# Patient Record
Sex: Male | Born: 1940 | Race: White | Hispanic: No | Marital: Married | Smoking: Never smoker
Health system: Southern US, Community
[De-identification: ages and names within clinical notes are randomized; demographics above are authoritative.]

## PROBLEM LIST (undated history)

## (undated) DIAGNOSIS — S42411A Displaced simple supracondylar fracture without intercondylar fracture of right humerus, initial encounter for closed fracture: Secondary | ICD-10-CM

## (undated) DIAGNOSIS — H579 Unspecified disorder of eye and adnexa: Secondary | ICD-10-CM

## (undated) DIAGNOSIS — I10 Essential (primary) hypertension: Secondary | ICD-10-CM

## (undated) DIAGNOSIS — H532 Diplopia: Secondary | ICD-10-CM

## (undated) HISTORY — PX: SHOULDER SURGERY: SHX246

## (undated) HISTORY — DX: Unspecified disorder of eye and adnexa: H57.9

## (undated) HISTORY — DX: Essential (primary) hypertension: I10

## (undated) HISTORY — DX: Diplopia: H53.2

---

## 2002-10-12 ENCOUNTER — Ambulatory Visit (HOSPITAL_COMMUNITY): Admission: RE | Admit: 2002-10-12 | Discharge: 2002-10-12 | Payer: Self-pay | Admitting: Gastroenterology

## 2014-10-19 ENCOUNTER — Encounter (HOSPITAL_COMMUNITY): Payer: Self-pay | Admitting: Emergency Medicine

## 2014-10-19 ENCOUNTER — Emergency Department (HOSPITAL_COMMUNITY)
Admission: EM | Admit: 2014-10-19 | Discharge: 2014-10-19 | Disposition: A | Discharge status: Home / Self Care | Payer: Worker's Compensation | Attending: Emergency Medicine | Admitting: Emergency Medicine

## 2014-10-19 ENCOUNTER — Emergency Department (HOSPITAL_COMMUNITY): Payer: Worker's Compensation

## 2014-10-19 DIAGNOSIS — S42421A Displaced comminuted supracondylar fracture without intercondylar fracture of right humerus, initial encounter for closed fracture: Secondary | ICD-10-CM | POA: Insufficient documentation

## 2014-10-19 DIAGNOSIS — S42411A Displaced simple supracondylar fracture without intercondylar fracture of right humerus, initial encounter for closed fracture: Secondary | ICD-10-CM

## 2014-10-19 DIAGNOSIS — Y9301 Activity, walking, marching and hiking: Secondary | ICD-10-CM | POA: Insufficient documentation

## 2014-10-19 DIAGNOSIS — S4991XA Unspecified injury of right shoulder and upper arm, initial encounter: Secondary | ICD-10-CM | POA: Diagnosis present

## 2014-10-19 DIAGNOSIS — Y92481 Parking lot as the place of occurrence of the external cause: Secondary | ICD-10-CM | POA: Diagnosis not present

## 2014-10-19 DIAGNOSIS — Y998 Other external cause status: Secondary | ICD-10-CM | POA: Insufficient documentation

## 2014-10-19 MED ORDER — HYDROCODONE-ACETAMINOPHEN 5-325 MG PO TABS: 1.0000 | ORAL_TABLET | ORAL | Status: DC | PRN

## 2014-10-19 NOTE — Discharge Instructions (Signed)
Follow-up with orthopedics preferably went into the week. Splint, sling, ice pack, pain medicines.
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
## 2014-10-19 NOTE — ED Notes (Signed)
Placed call to Ortho tech and they will splint long arm
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
## 2014-10-20 ENCOUNTER — Other Ambulatory Visit: Payer: Self-pay | Admitting: Orthopedic Surgery

## 2014-10-20 DIAGNOSIS — R52 Pain, unspecified: Secondary | ICD-10-CM

## 2014-10-24 ENCOUNTER — Inpatient Hospital Stay: Admission: RE | Admit: 2014-10-24 | Payer: Self-pay | Source: Ambulatory Visit

## 2014-10-24 ENCOUNTER — Ambulatory Visit
Admission: RE | Admit: 2014-10-24 | Discharge: 2014-10-24 | Disposition: A | Discharge status: Home / Self Care | Payer: Worker's Compensation | Source: Ambulatory Visit | Attending: Orthopedic Surgery | Admitting: Orthopedic Surgery

## 2014-10-24 DIAGNOSIS — R52 Pain, unspecified: Secondary | ICD-10-CM

## 2014-10-25 ENCOUNTER — Encounter (HOSPITAL_BASED_OUTPATIENT_CLINIC_OR_DEPARTMENT_OTHER): Payer: Self-pay | Admitting: *Deleted

## 2014-10-25 NOTE — Progress Notes (Signed)
Bring all medications. Pack an overnight bag. 

## 2014-10-28 ENCOUNTER — Ambulatory Visit (HOSPITAL_BASED_OUTPATIENT_CLINIC_OR_DEPARTMENT_OTHER): Payer: Worker's Compensation | Admitting: Anesthesiology

## 2014-10-28 ENCOUNTER — Encounter (HOSPITAL_BASED_OUTPATIENT_CLINIC_OR_DEPARTMENT_OTHER): Payer: Self-pay | Admitting: Anesthesiology

## 2014-10-28 ENCOUNTER — Ambulatory Visit (HOSPITAL_BASED_OUTPATIENT_CLINIC_OR_DEPARTMENT_OTHER)
Admission: RE | Admit: 2014-10-28 | Discharge: 2014-10-29 | Disposition: A | Discharge status: Home / Self Care | Payer: Worker's Compensation | Source: Ambulatory Visit | Attending: Orthopedic Surgery | Admitting: Orthopedic Surgery

## 2014-10-28 ENCOUNTER — Ambulatory Visit (HOSPITAL_COMMUNITY): Payer: Worker's Compensation

## 2014-10-28 ENCOUNTER — Encounter (HOSPITAL_BASED_OUTPATIENT_CLINIC_OR_DEPARTMENT_OTHER)
Admission: RE | Disposition: A | Discharge status: Home / Self Care | Payer: Self-pay | Source: Ambulatory Visit | Attending: Orthopedic Surgery

## 2014-10-28 DIAGNOSIS — S42411A Displaced simple supracondylar fracture without intercondylar fracture of right humerus, initial encounter for closed fracture: Secondary | ICD-10-CM | POA: Diagnosis present

## 2014-10-28 DIAGNOSIS — S42409A Unspecified fracture of lower end of unspecified humerus, initial encounter for closed fracture: Secondary | ICD-10-CM | POA: Diagnosis present

## 2014-10-28 DIAGNOSIS — Z8781 Personal history of (healed) traumatic fracture: Secondary | ICD-10-CM

## 2014-10-28 DIAGNOSIS — Z9889 Other specified postprocedural states: Secondary | ICD-10-CM

## 2014-10-28 DIAGNOSIS — Z4789 Encounter for other orthopedic aftercare: Secondary | ICD-10-CM

## 2014-10-28 HISTORY — PX: ULNAR NERVE TRANSPOSITION: SHX2595

## 2014-10-28 HISTORY — PX: ORIF HUMERUS FRACTURE: SHX2126

## 2014-10-28 HISTORY — DX: Displaced simple supracondylar fracture without intercondylar fracture of right humerus, initial encounter for closed fracture: S42.411A

## 2014-10-28 LAB — POCT HEMOGLOBIN-HEMACUE: Hemoglobin: 15.7 g/dL (ref 13.0–17.0)

## 2014-10-28 SURGERY — OPEN REDUCTION INTERNAL FIXATION (ORIF) DISTAL HUMERUS FRACTURE
Anesthesia: General | Site: Elbow | Laterality: Right

## 2014-10-28 MED ORDER — CEFAZOLIN SODIUM-DEXTROSE 2-3 GM-% IV SOLR
2.0000 g | INTRAVENOUS | Status: AC
  Administered 2014-10-28: 2 g via INTRAVENOUS

## 2014-10-28 MED ORDER — DEXAMETHASONE SODIUM PHOSPHATE 10 MG/ML IJ SOLN
INTRAMUSCULAR | Status: AC
  Filled 2014-10-28: qty 1

## 2014-10-28 MED ORDER — DOCUSATE SODIUM 100 MG PO CAPS
100.0000 mg | ORAL_CAPSULE | Freq: Two times a day (BID) | ORAL | Status: DC
  Administered 2014-10-28: 100 mg via ORAL
  Filled 2014-10-28: qty 1

## 2014-10-28 MED ORDER — SENNA-DOCUSATE SODIUM 8.6-50 MG PO TABS: 2.0000 | ORAL_TABLET | Freq: Every day | ORAL | Status: DC

## 2014-10-28 MED ORDER — OXYCODONE-ACETAMINOPHEN 5-325 MG PO TABS
1.0000 | ORAL_TABLET | ORAL | Status: DC | PRN
  Administered 2014-10-28 – 2014-10-29 (×2): 1 via ORAL
  Administered 2014-10-29: 2 via ORAL
  Filled 2014-10-28 (×2): qty 1
  Filled 2014-10-28: qty 2

## 2014-10-28 MED ORDER — PHENYLEPHRINE HCL 10 MG/ML IJ SOLN
10.0000 mg | INTRAVENOUS | Status: DC | PRN
  Administered 2014-10-28: 50 ug/min via INTRAVENOUS

## 2014-10-28 MED ORDER — BISACODYL 10 MG RE SUPP: 10.0000 mg | Freq: Every day | RECTAL | Status: DC | PRN

## 2014-10-28 MED ORDER — KETOROLAC TROMETHAMINE 30 MG/ML IJ SOLN
30.0000 mg | Freq: Once | INTRAMUSCULAR | Status: AC
  Administered 2014-10-28: 30 mg via INTRAVENOUS

## 2014-10-28 MED ORDER — MIDAZOLAM HCL 2 MG/2ML IJ SOLN
1.0000 mg | INTRAMUSCULAR | Status: DC | PRN
  Administered 2014-10-28: 0.5 mg via INTRAVENOUS

## 2014-10-28 MED ORDER — KETOROLAC TROMETHAMINE 15 MG/ML IJ SOLN
INTRAMUSCULAR | Status: AC
  Filled 2014-10-28: qty 2

## 2014-10-28 MED ORDER — FENTANYL CITRATE (PF) 100 MCG/2ML IJ SOLN
50.0000 ug | INTRAMUSCULAR | Status: AC | PRN
  Administered 2014-10-28 (×2): 25 ug via INTRAVENOUS
  Administered 2014-10-28 (×2): 50 ug via INTRAVENOUS

## 2014-10-28 MED ORDER — FENTANYL CITRATE (PF) 100 MCG/2ML IJ SOLN
INTRAMUSCULAR | Status: AC
  Filled 2014-10-28: qty 2

## 2014-10-28 MED ORDER — GLYCOPYRROLATE 0.2 MG/ML IJ SOLN: 0.2000 mg | Freq: Once | INTRAMUSCULAR | Status: DC | PRN

## 2014-10-28 MED ORDER — LIDOCAINE-EPINEPHRINE (PF) 1.5 %-1:200000 IJ SOLN
INTRAMUSCULAR | Status: DC | PRN
  Administered 2014-10-28: 10 mL via PERINEURAL

## 2014-10-28 MED ORDER — LIDOCAINE HCL (CARDIAC) 20 MG/ML IV SOLN
INTRAVENOUS | Status: DC | PRN
  Administered 2014-10-28: 70 mg via INTRAVENOUS

## 2014-10-28 MED ORDER — FENTANYL CITRATE (PF) 100 MCG/2ML IJ SOLN
INTRAMUSCULAR | Status: AC
  Filled 2014-10-28: qty 4

## 2014-10-28 MED ORDER — ARTIFICIAL TEARS OP OINT
TOPICAL_OINTMENT | OPHTHALMIC | Status: AC
  Filled 2014-10-28: qty 3.5

## 2014-10-28 MED ORDER — SUCCINYLCHOLINE CHLORIDE 20 MG/ML IJ SOLN
INTRAMUSCULAR | Status: DC | PRN
  Administered 2014-10-28: 100 mg via INTRAVENOUS

## 2014-10-28 MED ORDER — LACTATED RINGERS IV SOLN
INTRAVENOUS | Status: DC
  Administered 2014-10-28: 10 mL/h via INTRAVENOUS
  Administered 2014-10-28: 07:00:00 via INTRAVENOUS

## 2014-10-28 MED ORDER — VANCOMYCIN HCL IN DEXTROSE 1-5 GM/200ML-% IV SOLN
INTRAVENOUS | Status: AC
  Filled 2014-10-28: qty 200

## 2014-10-28 MED ORDER — PROPOFOL 10 MG/ML IV BOLUS
INTRAVENOUS | Status: DC | PRN
  Administered 2014-10-28: 50 mg via INTRAVENOUS
  Administered 2014-10-28: 100 mg via INTRAVENOUS

## 2014-10-28 MED ORDER — METHOCARBAMOL 500 MG PO TABS
500.0000 mg | ORAL_TABLET | Freq: Four times a day (QID) | ORAL | Status: DC | PRN
  Administered 2014-10-29: 500 mg via ORAL
  Filled 2014-10-28: qty 1

## 2014-10-28 MED ORDER — METOCLOPRAMIDE HCL 5 MG PO TABS: 5.0000 mg | ORAL_TABLET | Freq: Three times a day (TID) | ORAL | Status: DC | PRN

## 2014-10-28 MED ORDER — METOCLOPRAMIDE HCL 5 MG/ML IJ SOLN: 5.0000 mg | Freq: Three times a day (TID) | INTRAMUSCULAR | Status: DC | PRN

## 2014-10-28 MED ORDER — OXYCODONE-ACETAMINOPHEN 10-325 MG PO TABS: 1.0000 | ORAL_TABLET | Freq: Four times a day (QID) | ORAL | Status: DC | PRN

## 2014-10-28 MED ORDER — OXYCODONE HCL 5 MG PO TABS: 5.0000 mg | ORAL_TABLET | ORAL | Status: DC | PRN

## 2014-10-28 MED ORDER — ZOLPIDEM TARTRATE 5 MG PO TABS: 5.0000 mg | ORAL_TABLET | Freq: Every evening | ORAL | Status: DC | PRN

## 2014-10-28 MED ORDER — VANCOMYCIN HCL 1000 MG IV SOLR
1000.0000 mg | INTRAVENOUS | Status: DC | PRN
  Administered 2014-10-28: 1000 mg via INTRAVENOUS

## 2014-10-28 MED ORDER — POLYETHYLENE GLYCOL 3350 17 G PO PACK: 17.0000 g | PACK | Freq: Every day | ORAL | Status: DC | PRN

## 2014-10-28 MED ORDER — CEFAZOLIN SODIUM-DEXTROSE 2-3 GM-% IV SOLR
INTRAVENOUS | Status: AC
  Filled 2014-10-28: qty 50

## 2014-10-28 MED ORDER — BACLOFEN 10 MG PO TABS: 10.0000 mg | ORAL_TABLET | Freq: Three times a day (TID) | ORAL | Status: DC

## 2014-10-28 MED ORDER — SODIUM CHLORIDE 0.9 % IV SOLN
INTRAVENOUS | Status: DC
  Administered 2014-10-28: 14:00:00 via INTRAVENOUS

## 2014-10-28 MED ORDER — SENNA 8.6 MG PO TABS
1.0000 | ORAL_TABLET | Freq: Two times a day (BID) | ORAL | Status: DC
  Administered 2014-10-28: 8.6 mg via ORAL
  Filled 2014-10-28: qty 1

## 2014-10-28 MED ORDER — DEXTROSE 5 % IV SOLN: 500.0000 mg | Freq: Four times a day (QID) | INTRAVENOUS | Status: DC | PRN

## 2014-10-28 MED ORDER — ONDANSETRON HCL 4 MG PO TABS: 4.0000 mg | ORAL_TABLET | Freq: Four times a day (QID) | ORAL | Status: DC | PRN

## 2014-10-28 MED ORDER — PHENYLEPHRINE HCL 10 MG/ML IJ SOLN
INTRAMUSCULAR | Status: AC
  Filled 2014-10-28: qty 1

## 2014-10-28 MED ORDER — PROPOFOL 10 MG/ML IV BOLUS
INTRAVENOUS | Status: AC
  Filled 2014-10-28: qty 20

## 2014-10-28 MED ORDER — LIDOCAINE HCL (CARDIAC) 20 MG/ML IV SOLN
INTRAVENOUS | Status: AC
  Filled 2014-10-28: qty 5

## 2014-10-28 MED ORDER — PROMETHAZINE HCL 25 MG/ML IJ SOLN
6.2500 mg | INTRAMUSCULAR | Status: DC | PRN
Start: 2014-10-28 — End: 2014-10-29

## 2014-10-28 MED ORDER — DEXAMETHASONE SODIUM PHOSPHATE 4 MG/ML IJ SOLN
INTRAMUSCULAR | Status: DC | PRN
  Administered 2014-10-28: 10 mg via INTRAVENOUS

## 2014-10-28 MED ORDER — HYDROMORPHONE HCL 1 MG/ML IJ SOLN
0.5000 mg | INTRAMUSCULAR | Status: DC | PRN
  Administered 2014-10-29: 0.5 mg via INTRAVENOUS
  Administered 2014-10-29: 1 mg via INTRAVENOUS
  Administered 2014-10-29: 0.5 mg via INTRAVENOUS
  Filled 2014-10-28 (×2): qty 1

## 2014-10-28 MED ORDER — ONDANSETRON HCL 4 MG/2ML IJ SOLN
INTRAMUSCULAR | Status: DC | PRN
  Administered 2014-10-28: 4 mg via INTRAVENOUS

## 2014-10-28 MED ORDER — MAGNESIUM CITRATE PO SOLN: 1.0000 | Freq: Once | ORAL | Status: DC | PRN

## 2014-10-28 MED ORDER — BUPIVACAINE HCL (PF) 0.5 % IJ SOLN
INTRAMUSCULAR | Status: DC | PRN
  Administered 2014-10-28: 20 mL via PERINEURAL

## 2014-10-28 MED ORDER — SCOPOLAMINE 1 MG/3DAYS TD PT72: 1.0000 | MEDICATED_PATCH | Freq: Once | TRANSDERMAL | Status: DC | PRN

## 2014-10-28 MED ORDER — ONDANSETRON HCL 4 MG/2ML IJ SOLN: 4.0000 mg | Freq: Four times a day (QID) | INTRAMUSCULAR | Status: DC | PRN

## 2014-10-28 MED ORDER — FENTANYL CITRATE (PF) 100 MCG/2ML IJ SOLN
25.0000 ug | INTRAMUSCULAR | Status: DC | PRN
  Administered 2014-10-28: 50 ug via INTRAVENOUS

## 2014-10-28 MED ORDER — SUCCINYLCHOLINE CHLORIDE 20 MG/ML IJ SOLN
INTRAMUSCULAR | Status: AC
  Filled 2014-10-28: qty 1

## 2014-10-28 MED ORDER — ONDANSETRON HCL 4 MG/2ML IJ SOLN
INTRAMUSCULAR | Status: AC
  Filled 2014-10-28: qty 2

## 2014-10-28 MED ORDER — ONDANSETRON HCL 4 MG PO TABS: 4.0000 mg | ORAL_TABLET | Freq: Three times a day (TID) | ORAL | Status: DC | PRN

## 2014-10-28 MED ORDER — CEFAZOLIN SODIUM 1-5 GM-% IV SOLN
1.0000 g | Freq: Four times a day (QID) | INTRAVENOUS | Status: AC
  Administered 2014-10-28 – 2014-10-29 (×3): 1 g via INTRAVENOUS
  Filled 2014-10-28 (×3): qty 50

## 2014-10-28 MED ORDER — MIDAZOLAM HCL 2 MG/2ML IJ SOLN
INTRAMUSCULAR | Status: AC
  Filled 2014-10-28: qty 2

## 2014-10-28 SURGICAL SUPPLY — 115 items
9 Hole Posterior Lateral Right Plate (Plate) ×3 IMPLANT
9 hole Medial Right Plate (Plate) ×3 IMPLANT
BANDAGE ELASTIC 3 VELCRO ST LF (GAUZE/BANDAGES/DRESSINGS) ×3 IMPLANT
BANDAGE ELASTIC 4 VELCRO ST LF (GAUZE/BANDAGES/DRESSINGS) ×3 IMPLANT
BIT DRILL 2.5X2.75 QC CALB (BIT) ×3 IMPLANT
BIT DRILL CALIBRATED 2.7 (BIT) ×2 IMPLANT
BIT DRILL CALIBRATED 2.7MM (BIT) ×1
BLADE AVERAGE 25MMX9MM (BLADE) ×1
BLADE AVERAGE 25X9 (BLADE) ×2 IMPLANT
BLADE HEX COATED 2.75 (ELECTRODE) ×3 IMPLANT
BLADE MINI RND TIP GREEN BEAV (BLADE) IMPLANT
BLADE OSC/SAG .038X5.5 CUT EDG (BLADE) ×3 IMPLANT
BLADE SURG 15 STRL LF DISP TIS (BLADE) ×1 IMPLANT
BLADE SURG 15 STRL SS (BLADE) ×2
BNDG COHESIVE 4X5 TAN STRL (GAUZE/BANDAGES/DRESSINGS) ×3 IMPLANT
BNDG ESMARK 4X9 LF (GAUZE/BANDAGES/DRESSINGS) ×3 IMPLANT
CANISTER SUCT 1200ML W/VALVE (MISCELLANEOUS) ×3 IMPLANT
CHLORAPREP W/TINT 26ML (MISCELLANEOUS) ×3 IMPLANT
CLOSURE STERI-STRIP 1/2X4 (GAUZE/BANDAGES/DRESSINGS) ×2
CLSR STERI-STRIP ANTIMIC 1/2X4 (GAUZE/BANDAGES/DRESSINGS) ×4 IMPLANT
CORDS BIPOLAR (ELECTRODE) ×3 IMPLANT
COVER BACK TABLE 60X90IN (DRAPES) ×3 IMPLANT
COVER MAYO STAND STRL (DRAPES) ×3 IMPLANT
CUFF TOURN SGL LL 18 NRW (TOURNIQUET CUFF) ×3 IMPLANT
CUFF TOURNIQUET SINGLE 18IN (TOURNIQUET CUFF) IMPLANT
CUFF TOURNIQUET SINGLE 24IN (TOURNIQUET CUFF) IMPLANT
Circlage Cable with Needle and Crimps 1.3mm Diamet (Cable) ×3 IMPLANT
DECANTER SPIKE VIAL GLASS SM (MISCELLANEOUS) IMPLANT
DRAIN PENROSE 1/2X12 LTX STRL (WOUND CARE) IMPLANT
DRAPE C-ARM 42X72 X-RAY (DRAPES) ×3 IMPLANT
DRAPE EXTREMITY T 121X128X90 (DRAPE) IMPLANT
DRAPE IMP U-DRAPE 54X76 (DRAPES) ×6 IMPLANT
DRAPE INCISE IOBAN 66X45 STRL (DRAPES) ×3 IMPLANT
DRAPE OEC MINIVIEW 54X84 (DRAPES) IMPLANT
DRAPE SURG 17X23 STRL (DRAPES) ×3 IMPLANT
DRAPE U 20/CS (DRAPES) ×3 IMPLANT
DURAPREP 26ML APPLICATOR (WOUND CARE) ×3 IMPLANT
ELECT REM PT RETURN 9FT ADLT (ELECTROSURGICAL) ×3
ELECTRODE REM PT RTRN 9FT ADLT (ELECTROSURGICAL) ×1 IMPLANT
GAUZE SPONGE 4X4 12PLY STRL (GAUZE/BANDAGES/DRESSINGS) ×3 IMPLANT
GLOVE BIO SURGEON STRL SZ8 (GLOVE) ×3 IMPLANT
GLOVE BIOGEL PI IND STRL 6.5 (GLOVE) ×2 IMPLANT
GLOVE BIOGEL PI IND STRL 7.0 (GLOVE) ×3 IMPLANT
GLOVE BIOGEL PI IND STRL 7.5 (GLOVE) ×1 IMPLANT
GLOVE BIOGEL PI IND STRL 8 (GLOVE) ×2 IMPLANT
GLOVE BIOGEL PI INDICATOR 6.5 (GLOVE) ×4
GLOVE BIOGEL PI INDICATOR 7.0 (GLOVE) ×6
GLOVE BIOGEL PI INDICATOR 7.5 (GLOVE) ×2
GLOVE BIOGEL PI INDICATOR 8 (GLOVE) ×4
GLOVE ECLIPSE 6.5 STRL STRAW (GLOVE) ×6 IMPLANT
GLOVE ORTHO TXT STRL SZ7.5 (GLOVE) ×3 IMPLANT
GLOVE SURG ORTHO 8.0 STRL STRW (GLOVE) ×3 IMPLANT
GOWN STRL REUS W/ TWL LRG LVL3 (GOWN DISPOSABLE) ×2 IMPLANT
GOWN STRL REUS W/ TWL XL LVL3 (GOWN DISPOSABLE) ×2 IMPLANT
GOWN STRL REUS W/TWL LRG LVL3 (GOWN DISPOSABLE) ×4
GOWN STRL REUS W/TWL XL LVL3 (GOWN DISPOSABLE) ×4
K-WIRE FIXATION 2.0X6 (WIRE) ×3
K-WIRE SGLE END .054 LG (WIRE) ×12
KWIRE FIXATION 2.0X6 (WIRE) ×1 IMPLANT
KWIRE SGLE END .054 LG (WIRE) ×4 IMPLANT
LOOP VESSEL MAXI BLUE (MISCELLANEOUS) ×3 IMPLANT
NEEDLE HYPO 25X1 1.5 SAFETY (NEEDLE) IMPLANT
NS IRRIG 1000ML POUR BTL (IV SOLUTION) ×3 IMPLANT
PACK BASIN DAY SURGERY FS (CUSTOM PROCEDURE TRAY) ×3 IMPLANT
PAD CAST 3X4 CTTN HI CHSV (CAST SUPPLIES) ×1 IMPLANT
PAD CAST 4YDX4 CTTN HI CHSV (CAST SUPPLIES) ×1 IMPLANT
PADDING CAST ABS 3INX4YD NS (CAST SUPPLIES) ×2
PADDING CAST ABS 4INX4YD NS (CAST SUPPLIES) ×2
PADDING CAST ABS COTTON 3X4 (CAST SUPPLIES) ×1 IMPLANT
PADDING CAST ABS COTTON 4X4 ST (CAST SUPPLIES) ×1 IMPLANT
PADDING CAST COTTON 3X4 STRL (CAST SUPPLIES) ×2
PADDING CAST COTTON 4X4 STRL (CAST SUPPLIES) ×2
PENCIL BUTTON HOLSTER BLD 10FT (ELECTRODE) ×3 IMPLANT
SCREW CORT T15 28X3.5XST LCK (Screw) ×1 IMPLANT
SCREW CORTICAL 3.5X28MM (Screw) ×2 IMPLANT
SCREW CORTICAL LOW PROF 3.5X20 (Screw) ×3 IMPLANT
SCREW LOCK CORT STAR 3.5X10 (Screw) ×9 IMPLANT
SCREW LOCK CORT STAR 3.5X22 (Screw) ×3 IMPLANT
SCREW LOW PROFILE 18MMX3.5MM (Screw) ×3 IMPLANT
SCREW LP 3.5 (Screw) ×3 IMPLANT
SCREW LP 3.5X65MM (Screw) ×3 IMPLANT
SCREW NON LOCKING LP 3.5 14MM (Screw) ×3 IMPLANT
SHEET MEDIUM DRAPE 40X70 STRL (DRAPES) ×3 IMPLANT
SLEEVE SCD COMPRESS KNEE MED (MISCELLANEOUS) ×3 IMPLANT
SLING ARM FOAM STRAP LRG (SOFTGOODS) ×3 IMPLANT
SPLINT FAST PLASTER 5X30 (CAST SUPPLIES) ×20
SPLINT PLASTER CAST FAST 5X30 (CAST SUPPLIES) ×10 IMPLANT
SPLINT PLASTER CAST XFAST 3X15 (CAST SUPPLIES) IMPLANT
SPLINT PLASTER CAST XFAST 4X15 (CAST SUPPLIES) IMPLANT
SPLINT PLASTER XTRA FAST SET 4 (CAST SUPPLIES)
SPLINT PLASTER XTRA FASTSET 3X (CAST SUPPLIES)
SPONGE LAP 4X18 X RAY DECT (DISPOSABLE) ×9 IMPLANT
STOCKINETTE 4X48 STRL (DRAPES) ×3 IMPLANT
SUCTION FRAZIER TIP 10 FR DISP (SUCTIONS) ×3 IMPLANT
SUT ETHIBOND 0 MO6 C/R (SUTURE) IMPLANT
SUT ETHILON 3 0 PS 1 (SUTURE) IMPLANT
SUT ETHILON 4 0 PS 2 18 (SUTURE) IMPLANT
SUT MNCRL AB 4-0 PS2 18 (SUTURE) ×3 IMPLANT
SUT VIC AB 0 CT1 18XCR BRD 8 (SUTURE) IMPLANT
SUT VIC AB 0 CT1 27 (SUTURE) ×6
SUT VIC AB 0 CT1 27XBRD ANBCTR (SUTURE) ×3 IMPLANT
SUT VIC AB 0 CT1 8-18 (SUTURE)
SUT VIC AB 0 SH 27 (SUTURE) IMPLANT
SUT VIC AB 2-0 SH 18 (SUTURE) IMPLANT
SUT VIC AB 3-0 SH 27 (SUTURE)
SUT VIC AB 3-0 SH 27X BRD (SUTURE) IMPLANT
SUT VICRYL 3-0 CR8 SH (SUTURE) ×3 IMPLANT
SYR BULB 3OZ (MISCELLANEOUS) ×3 IMPLANT
SYR CONTROL 10ML LL (SYRINGE) IMPLANT
TOWEL OR 17X24 6PK STRL BLUE (TOWEL DISPOSABLE) ×3 IMPLANT
TOWEL OR NON WOVEN STRL DISP B (DISPOSABLE) ×3 IMPLANT
TUBE CONNECTING 20'X1/4 (TUBING) ×1
TUBE CONNECTING 20X1/4 (TUBING) ×2 IMPLANT
UNDERPAD 30X30 (UNDERPADS AND DIAPERS) ×3 IMPLANT
YANKAUER SUCT BULB TIP NO VENT (SUCTIONS) ×3 IMPLANT

## 2014-10-28 NOTE — Anesthesia Postprocedure Evaluation (Signed)
  Anesthesia Post-op Note  Patient: Dustin Le  Procedure(s) Performed: Procedure(s) (LRB): RIGHT OPEN REDUCTION INTERNAL FIXATION (ORIF) DISTAL HUMERUS; OSTEOTOMY ULNA (Right) TRANSPOSITION NEUROPLASTY ULNAR NERVE ELBOW  (Right)  Patient Location: PACU  Anesthesia Type: GA combined with regional for post-op pain  Level of Consciousness: awake and alert   Airway and Oxygen Therapy: Patient Spontanous Breathing  Post-op Pain: mild  Post-op Assessment: Post-op Vital signs reviewed, Patient's Cardiovascular Status Stable, Respiratory Function Stable, Patent Airway and No signs of Nausea or vomiting  Last Vitals:  Filed Vitals:   10/28/14 1230  BP:   Pulse: 79  Temp:   Resp: 18    Post-op Vital Signs: stable   Complications: No apparent anesthesia complications

## 2014-10-28 NOTE — Anesthesia Preprocedure Evaluation (Signed)
Anesthesia Evaluation  Patient identified by MRN, date of birth, ID band Patient awake    Reviewed: Allergy & Precautions, NPO status , Patient's Chart, lab work & pertinent test results  Airway Mallampati: II  TM Distance: >3 FB Neck ROM: Full    Dental no notable dental hx.    Pulmonary neg pulmonary ROS,  breath sounds clear to auscultation  Pulmonary exam normal       Cardiovascular negative cardio ROS Normal cardiovascular examRhythm:Regular Rate:Normal     Neuro/Psych negative neurological ROS  negative psych ROS   GI/Hepatic negative GI ROS, Neg liver ROS,   Endo/Other  negative endocrine ROS  Renal/GU negative Renal ROS  negative genitourinary   Musculoskeletal negative musculoskeletal ROS (+)   Abdominal   Peds negative pediatric ROS (+)  Hematology negative hematology ROS (+)   Anesthesia Other Findings   Reproductive/Obstetrics negative OB ROS                             Anesthesia Physical Anesthesia Plan  ASA: II  Anesthesia Plan: General   Post-op Pain Management: GA combined w/ Regional for post-op pain   Induction: Intravenous  Airway Management Planned: Oral ETT  Additional Equipment:   Intra-op Plan:   Post-operative Plan: Extubation in OR  Informed Consent: I have reviewed the patients History and Physical, chart, labs and discussed the procedure including the risks, benefits and alternatives for the proposed anesthesia with the patient or authorized representative who has indicated his/her understanding and acceptance.   Dental advisory given  Plan Discussed with: CRNA and Surgeon  Anesthesia Plan Comments:         Anesthesia Quick Evaluation
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
## 2014-10-28 NOTE — Progress Notes (Signed)
Assisted Dr. Rose with right, ultrasound guided, supraclavicular block. Side rails up, monitors on throughout procedure. See vital signs in flow sheet. Tolerated Procedure well. 

## 2014-10-28 NOTE — Anesthesia Procedure Notes (Addendum)
Anesthesia Regional Block:  Supraclavicular block  Pre-Anesthetic Checklist: ,, timeout performed, Correct Patient, Correct Site, Correct Laterality, Correct Procedure, Correct Position, site marked, Risks and benefits discussed,  Surgical consent,  Pre-op evaluation,  At surgeon's request and post-op pain management  Laterality: Right  Prep: chloraprep       Needles:  Injection technique: Single-shot  Needle Type: Echogenic Needle     Needle Length: 9cm 9 cm Needle Gauge: 21 and 21 G    Additional Needles:  Procedures: ultrasound guided (picture in chart) and nerve stimulator Supraclavicular block Narrative:  Injection made incrementally with aspirations every 5 mL.  Performed by: Personally   Additional Notes: Risks, benefits and alternative to block explained extensively.  Patient tolerated procedure well, without complications.   Procedure Name: Intubation Performed by: York Grice Pre-anesthesia Checklist: Patient identified, Timeout performed, Emergency Drugs available, Suction available and Patient being monitored Patient Re-evaluated:Patient Re-evaluated prior to inductionOxygen Delivery Method: Circle system utilized Preoxygenation: Pre-oxygenation with 100% oxygen Intubation Type: IV induction Ventilation: Mask ventilation without difficulty Laryngoscope Size: Miller and 2 Grade View: Grade I Tube type: Oral Tube size: 7.0 mm Number of attempts: 1 Airway Equipment and Method: Stylet Placement Confirmation: ETT inserted through vocal cords under direct vision,  positive ETCO2 and breath sounds checked- equal and bilateral Secured at: 22 cm Tube secured with: Tape Dental Injury: Teeth and Oropharynx as per pre-operative assessment

## 2014-10-28 NOTE — Discharge Instructions (Addendum)
Diet: As you were doing prior to hospitalization   Shower:  May shower but keep the wounds dry, use an occlusive plastic wrap, NO SOAKING IN TUB.  If the bandage gets wet, change with a clean dry gauze.  If you have a splint on, leave the splint in place and keep the splint dry with a plastic bag.  Dressing:  You may change your dressing 3-5 days after surgery, unless you have a splint.  If you have a splint, then just leave the splint in place and we will change your bandages during your first follow-up appointment.    If you had hand or foot surgery, we will plan to remove your stitches in about 2 weeks in the office.  For all other surgeries, there are sticky tapes (steri-strips) on your wounds and all the stitches are absorbable.  Leave the steri-strips in place when changing your dressings, they will peel off with time, usually 2-3 weeks.  Activity:  Increase activity slowly as tolerated, but follow the weight bearing instructions below.  The rules on driving is that you can not be taking narcotics while you drive, and you must feel in control of the vehicle.    Weight Bearing:   Sling as desired.  Keep splint dry..    To prevent constipation: you may use a stool softener such as -  Colace (over the counter) 100 mg by mouth twice a day  Drink plenty of fluids (prune juice may be helpful) and high fiber foods Miralax (over the counter) for constipation as needed.    Itching:  If you experience itching with your medications, try taking only a single pain pill, or even half a pain pill at a time.  You may take up to 10 pain pills per day, and you can also use benadryl over the counter for itching or also to help with sleep.   Precautions:  If you experience chest pain or shortness of breath - call 911 immediately for transfer to the hospital emergency department!!  If you develop a fever greater that 101 F, purulent drainage from wound, increased redness or drainage from wound, or calf pain  -- Call the office at [phone]                                                Follow- Up Appointment:  Please call for an appointment to be seen in 2 weeks [facility] - [phone]      Post Anesthesia Home Care Instructions  Activity: Get plenty of rest for the remainder of the day. A responsible adult should stay with you for 24 hours following the procedure.  For the next 24 hours, DO NOT: -Drive a car -Advertising copywriter -Drink alcoholic beverages -Take any medication unless instructed by your physician -Make any legal decisions or sign important papers.  Meals: Start with liquid foods such as gelatin or soup. Progress to regular foods as tolerated. Avoid greasy, spicy, heavy foods. If nausea and/or vomiting occur, drink only clear liquids until the nausea and/or vomiting subsides. Call your physician if vomiting continues.  Special Instructions/Symptoms: Your throat may feel dry or sore from the anesthesia or the breathing tube placed in your throat during surgery. If this causes discomfort, gargle with warm salt water. The discomfort should disappear within 24 hours.  If you had a scopolamine patch placed behind  your ear for the management of post- operative nausea and/or vomiting:  1. The medication in the patch is effective for 72 hours, after which it should be removed.  Wrap patch in a tissue and discard in the trash. Wash hands thoroughly with soap and water. 2. You may remove the patch earlier than 72 hours if you experience unpleasant side effects which may include dry mouth, dizziness or visual disturbances. 3. Avoid touching the patch. Wash your hands with soap and water after contact with the patch.   Regional Anesthesia Blocks  1. Numbness or the inability to move the "blocked" extremity may last from 3-48 hours after placement. The length of time depends on the medication injected and your individual response to the medication. If the numbness is not going away  after 48 hours, call your surgeon.  2. The extremity that is blocked will need to be protected until the numbness is gone and the  Strength has returned. Because you cannot feel it, you will need to take extra care to avoid injury. Because it may be weak, you may have difficulty moving it or using it. You may not know what position it is in without looking at it while the block is in effect.  3. For blocks in the legs and feet, returning to weight bearing and walking needs to be done carefully. You will need to wait until the numbness is entirely gone and the strength has returned. You should be able to move your leg and foot normally before you try and bear weight or walk. You will need someone to be with you when you first try to ensure you do not fall and possibly risk injury.  4. Bruising and tenderness at the needle site are common side effects and will resolve in a few days.  5. Persistent numbness or new problems with movement should be communicated to the surgeon or the [facility] [phone] [place] [phone]).

## 2014-10-28 NOTE — Transfer of Care (Signed)
Immediate Anesthesia Transfer of Care Note  Patient: Dustin Le  Procedure(s) Performed: Procedure(s): RIGHT OPEN REDUCTION INTERNAL FIXATION (ORIF) DISTAL HUMERUS; OSTEOTOMY ULNA (Right) TRANSPOSITION NEUROPLASTY ULNAR NERVE ELBOW  (Right)  Patient Location: PACU  Anesthesia Type:General and GA combined with regional for post-op pain  Level of Consciousness: awake and alert   Airway & Oxygen Therapy: Patient Spontanous Breathing and Patient connected to face mask oxygen  Post-op Assessment: Report given to RN and Post -op Vital signs reviewed and stable  Post vital signs: Reviewed and stable  Last Vitals:  Filed Vitals:   10/28/14 0700  BP: 139/82  Pulse: 71  Temp:   Resp:     Complications: No apparent anesthesia complications
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
## 2014-10-29 DIAGNOSIS — S42411A Displaced simple supracondylar fracture without intercondylar fracture of right humerus, initial encounter for closed fracture: Secondary | ICD-10-CM | POA: Diagnosis not present

## 2014-11-01 ENCOUNTER — Encounter (HOSPITAL_BASED_OUTPATIENT_CLINIC_OR_DEPARTMENT_OTHER): Payer: Self-pay | Admitting: Orthopedic Surgery

## 2014-11-04 ENCOUNTER — Encounter (HOSPITAL_BASED_OUTPATIENT_CLINIC_OR_DEPARTMENT_OTHER): Payer: Self-pay | Admitting: Orthopedic Surgery

## 2015-08-04 ENCOUNTER — Other Ambulatory Visit: Payer: Self-pay | Admitting: Family Medicine

## 2015-08-04 DIAGNOSIS — H532 Diplopia: Secondary | ICD-10-CM

## 2015-08-07 ENCOUNTER — Other Ambulatory Visit: Payer: Self-pay | Admitting: Family Medicine

## 2015-08-07 DIAGNOSIS — H532 Diplopia: Secondary | ICD-10-CM

## 2015-08-10 ENCOUNTER — Other Ambulatory Visit: Payer: Self-pay

## 2015-08-15 ENCOUNTER — Other Ambulatory Visit: Payer: Self-pay

## 2015-08-16 ENCOUNTER — Ambulatory Visit
Admission: RE | Admit: 2015-08-16 | Discharge: 2015-08-16 | Disposition: A | Discharge status: Home / Self Care | Payer: Medicare Other | Source: Ambulatory Visit | Attending: Family Medicine | Admitting: Family Medicine

## 2015-08-16 DIAGNOSIS — H532 Diplopia: Secondary | ICD-10-CM

## 2015-08-16 MED ORDER — GADOBENATE DIMEGLUMINE 529 MG/ML IV SOLN
15.0000 mL | Freq: Once | INTRAVENOUS | Status: AC | PRN
Start: 2015-08-16 — End: 2015-08-16
  Administered 2015-08-16: 15 mL via INTRAVENOUS

## 2015-08-28 ENCOUNTER — Encounter: Payer: Self-pay | Admitting: Neurology

## 2015-08-28 ENCOUNTER — Ambulatory Visit (INDEPENDENT_AMBULATORY_CARE_PROVIDER_SITE_OTHER): Payer: Medicare Other | Admitting: Neurology

## 2015-08-28 VITALS — BP 145/87 | HR 72 | Ht 66.75 in | Wt 171.8 lb

## 2015-08-28 DIAGNOSIS — H532 Diplopia: Secondary | ICD-10-CM | POA: Diagnosis not present

## 2015-08-28 MED ORDER — PYRIDOSTIGMINE BROMIDE 60 MG PO TABS: 60.0000 mg | ORAL_TABLET | Freq: Three times a day (TID) | ORAL | Status: DC

## 2015-08-28 NOTE — Progress Notes (Signed)
PATIENT: Dustin Le DOB: 07-27-1940  Chief Complaint  Patient presents with  . Diplopia    He is here with his wife, [name].  He started experiencing double vision on 08/01/15 and had an abnormal evaluation with Dr. Bing Plume.  He was sent to his PCP who ordered MRI scans of his orbits and brain.  He has continued with this vison disturbance and also feels his depth perception is "off".     HISTORICAL  Dustin Le is a 75 years old right handed male, accompanied by his wife, seen in refer by his primary care physician Dr. Melinda Crutch for evaluation of diplopia on July 3rd 2017.  I reviewed and summarized the referring note, he had one episode right vetreous bleeding in 2014, was treated with laser at [facility], most recent lab in June 2017, showed normal CBC,Hg 16.8, normal CMP, creat 0.78, ESR was one, LDL was mildly elevated 107, cholesterol 171.  On June 6th 2017, while working on his home project,he noticed depth perception difficulty, later he noticed constant double vision, worst when looking to right, mostly vertical double vision,  better when looking to the left, began to appear as gaze close to midline.  His double vision seems to be least during early morning.  He denies ptosis, bulbar or limb muscle weakness. He denies dizziness.  He was seen by opthamologist Dr. 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 No discrete aneurysm is evident, although intracranial MRA (without contrast) or CTA (with contrast) would be necessary to exclude aneurysm. Otherwise unremarkable for age MRI appearance of the brain and orbits. No cavernous sinus or skullbase abnormality.   REVIEW OF SYSTEMS: Full 14 system review of systems performed and notable only for as above  ALLERGIES: No Known Allergies  HOME MEDICATIONS: No current outpatient prescriptions on file.   No current facility-administered medications for this visit.    PAST MEDICAL HISTORY: Past Medical History  Diagnosis Date  . Right supracondylar humerus fracture 10/28/2014  . Hypertension   . Eye problem     Right eye bleed  . Diplopia     PAST SURGICAL HISTORY: Past Surgical History  Procedure Laterality Date  . Shoulder surgery Right     Rotator cuff  . Orif humerus fracture Right 10/28/2014    Procedure: RIGHT OPEN REDUCTION INTERNAL FIXATION (ORIF) DISTAL HUMERUS; OSTEOTOMY ULNA;  Surgeon: Marchia Bond, MD;  Location: [place];  Service: Orthopedics;  Laterality: Right;  . Ulnar nerve transposition Right 10/28/2014    Procedure: TRANSPOSITION NEUROPLASTY ULNAR NERVE ELBOW ;  Surgeon: Marchia Bond, MD;  Location: [place];  Service: Orthopedics;  Laterality: Right;    FAMILY HISTORY: Family History  Problem Relation Age of Onset  . Diabetes Mother   . Diabetes Father   . Hypertension Father   . Glaucoma Father     SOCIAL HISTORY:  Social History  Social History  . Marital Status: Married    Spouse Name: N/A  . Number of Children: 2  . Years of Education: Bachelors   Occupational History  . Retired   . Drives for [employer]    Social History Main Topics  . Smoking status: Never Smoker   . Smokeless tobacco: Never Used  . Alcohol Use: No  . Drug Use: No  . Sexual Activity: Not on  file   Other Topics Concern  . Not on file   Social History Narrative   Lives at home with his wife.   Right-handed.   2 cups caffeine per day.     PHYSICAL EXAM   Filed Vitals:   08/28/15 0723  BP: 145/87  Pulse: 72  Height: 5' 6.75" (1.695 m)  Weight: 171 lb 12 oz (77.905 kg)    Not recorded      Body mass index is 27.12 kg/(m^2).  PHYSICAL EXAMNIATION:  Gen: NAD, conversant, well nourised, obese, well groomed                     Cardiovascular: Regular rate rhythm, no peripheral edema, warm, nontender. Eyes: Conjunctivae clear without exudates or hemorrhage Neck: Supple, no carotid bruise. Pulmonary: Clear to auscultation bilaterally   NEUROLOGICAL EXAM:  MENTAL STATUS: Speech:    Speech is normal; fluent and spontaneous with normal comprehension.  Cognition:     Orientation to time, place and person     Normal recent and remote memory     Normal Attention span and concentration     Normal Language, naming, repeating,spontaneous speech     Fund of knowledge   CRANIAL NERVES: CN II: Visual fields are full to confrontation. Pupil were small 15m, reactive to light, I was not able to visualize fundus.   CN III, IV, VI: cover and uncover testing showed right exo/inferopia, left exo/hyperopia, mild eye closure weakness. CN V: Facial sensation is intact to pinprick in all 3 divisions bilaterally. Corneal responses are intact.  CN VII: Face is symmetric with normal eye closure and smile. CN VIII: Hearing is normal to rubbing fingers CN IX, X: Palate elevates symmetrically. Phonation is normal. CN XI: Head turning and shoulder shrug are intact CN XII: Tongue is midline with normal movements and no atrophy.  MOTOR: He has slight neck flexion, mild bilateral shoulder abduction, elbow flexion, hip flexion weakness  REFLEXES: Reflexes are 2+ and symmetric at the biceps, triceps, knees, and ankles. Plantar responses are flexor.  SENSORY: Intact to light touch,  pinprick, positional sensation and vibratory sensation are intact in fingers and toes.  COORDINATION: Rapid alternating movements and fine finger movements are intact. There is no dysmetria on finger-to-nose and heel-knee-shin.    GAIT/STANCE: Posture is normal. Gait is steady with normal steps, base, arm swing, and turning. Heel and toe walking are normal. Tandem gait is normal.  Romberg is absent.   DIAGNOSTIC DATA (LABS, IMAGING, TESTING) - I reviewed patient records, labs, notes, testing and imaging myself where available.   ASSESSMENT AND PLAN  LKHUSH PASIONis a 75y.o. male   New Onset binocular diplopia,  With evidence of mild bilateral extraocular muscle, bulbar, limb muscle weakness.  Most suggestive of neuromuscular junctional disorder, such as myasthenia gravis  Lab, includes Ach receptor antibody, TSH  Trial of mestinon  Return to clinic in 2 weeks.  YMarcial Pacas M.D. Ph.D.  [facility] [address] [place] [address]Ph: [phone]  [phone] Fax: [phone]  CC: Lona Kettle, MD

## 2015-08-28 NOTE — Patient Instructions (Signed)
[email or web address]  Possible myasthenia gravis/neuromuscular junctional disorder

## 2015-08-31 ENCOUNTER — Other Ambulatory Visit: Payer: Self-pay | Admitting: *Deleted

## 2015-08-31 ENCOUNTER — Telehealth: Payer: Self-pay | Admitting: Neurology

## 2015-08-31 DIAGNOSIS — E538 Deficiency of other specified B group vitamins: Secondary | ICD-10-CM

## 2015-08-31 NOTE — Telephone Encounter (Signed)
I have spoken with pt's wife, Gigi Gineggy and per YY, advised it appears he is deficient in vit. B.  YY would like to confirm with one more lab test, and if vit. b12 level is still low, begin vit. b12 replacement.  She verbalized understanding of same. [facility] will come by the lab at their convenience M-Th 8a-1130a and 1230p-5p, and Friday 8a-12p/fim

## 2015-08-31 NOTE — Telephone Encounter (Signed)
Please call patient, laboratory evaluation showed decreased vitamin B12 172, he will benefit to repeat laboratory evaluation to confirm a diagnosis of vitamin B12 deficiency and then began vitamin B12 IM supplement, 1000 g daily for one week, every week for one month, then monthly.   Acetylcholine receptor binding antibody was negative, acetylcholine receptor blocking and modulating antibodies are pending.  Rest of the laboratory evaluations were normal  Please check on him if Mestinon 60 mg 3 times a day has helped his symptoms, may continue follow-up in September 12 2015

## 2015-09-01 ENCOUNTER — Other Ambulatory Visit (INDEPENDENT_AMBULATORY_CARE_PROVIDER_SITE_OTHER): Payer: Self-pay

## 2015-09-01 DIAGNOSIS — H532 Diplopia: Secondary | ICD-10-CM

## 2015-09-01 DIAGNOSIS — Z0289 Encounter for other administrative examinations: Secondary | ICD-10-CM

## 2015-09-01 DIAGNOSIS — E538 Deficiency of other specified B group vitamins: Secondary | ICD-10-CM

## 2015-09-02 LAB — VITAMIN B12: VITAMIN B 12: 178 pg/mL — AB (ref 211–946)

## 2015-09-05 ENCOUNTER — Ambulatory Visit
Admission: RE | Admit: 2015-09-05 | Discharge: 2015-09-05 | Disposition: A | Discharge status: Home / Self Care | Payer: Medicare Other | Source: Ambulatory Visit | Attending: Neurology | Admitting: Neurology

## 2015-09-05 DIAGNOSIS — H532 Diplopia: Secondary | ICD-10-CM | POA: Diagnosis not present

## 2015-09-12 ENCOUNTER — Telehealth: Payer: Self-pay | Admitting: Neurology

## 2015-09-12 ENCOUNTER — Encounter: Payer: Self-pay | Admitting: Neurology

## 2015-09-12 ENCOUNTER — Ambulatory Visit (INDEPENDENT_AMBULATORY_CARE_PROVIDER_SITE_OTHER): Payer: Medicare Other | Admitting: Neurology

## 2015-09-12 VITALS — BP 143/81 | HR 65 | Ht 66.75 in | Wt 172.8 lb

## 2015-09-12 DIAGNOSIS — H4912 Fourth [trochlear] nerve palsy, left eye: Secondary | ICD-10-CM | POA: Insufficient documentation

## 2015-09-12 DIAGNOSIS — H532 Diplopia: Secondary | ICD-10-CM

## 2015-09-12 MED ORDER — CYANOCOBALAMIN 1000 MCG/ML IJ SOLN: 1000.0000 ug | Freq: Once | INTRAMUSCULAR | Status: DC

## 2015-09-12 NOTE — Progress Notes (Signed)
Chief Complaint  Patient presents with  . Diplopia    He is here with his wife, [name].  He is still having intermittent blurred vision. He has also noted more fatigue recently.  Says there has not been any change in his symptoms since starting Mestinon.      PATIENT: Dustin Le DOB: [birth year]  Chief Complaint  Patient presents with  . Diplopia    He is here with his wife, [name].  He is still having intermittent blurred vision. He has also noted more fatigue recently.  Says there has not been any change in his symptoms since starting Mestinon.     HISTORICAL  Dustin Le is a 75 years old right handed male, accompanied by his wife, seen in refer by his primary care physician Dr. Melinda Crutch for evaluation of diplopia on July 3rd 2017.  I reviewed and summarized the referring note, he had one episode right vetreous bleeding in 2014, was treated with laser at [facility], most recent lab in June 2017, showed normal CBC,Hg 16.8, normal CMP, creat 0.78, ESR was one, LDL was mildly elevated 107, cholesterol 171.  On June 6th 2017, while working on his home project,he noticed depth perception difficulty, later he noticed constant double vision, worst when looking to right, mostly vertical double vision,  better when looking to the left, began to appear as gaze close to midline.  His double vision seems to be least during early morning.  He denies ptosis, bulbar or limb muscle weakness. He denies dizziness.  He was seen by opthamologist Dr. 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  Otherwise unremarkable for age MRI appearance of the brain and orbits. No cavernous sinus or skullbase abnormality.  UPDATE July 18th 2017: He was evaluated by ophthalmologist Dr. 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 Posey Pronto again, he continue complains of double vision when looking to the right side, on today's red less testing showed improvement, only has vertical double vision looking to the right inferior visual field.  We have personally reviewed MRA of the brain, mild intracranial atherosclerotic disease, especially on the left PCA  We have reviewed laboratory evaluation, low vitamin B12 172, elevated homocystine level, normal folic acid, normal TSH, negative ANA, ESR, C-reactive protein, acetylcholine receptor antibodies   he has tried to Mestinon without significant improvement, does complains of increased mucous secretion.  REVIEW OF SYSTEMS: Full 14 system review of systems performed and notable only for as above  ALLERGIES: No Known Allergies  HOME MEDICATIONS: Current Outpatient Prescriptions  Medication Sig Dispense Refill  . pyridostigmine (MESTINON) 60 MG tablet Take 1 tablet (60 mg total) by mouth 3 (three) times daily. 90 tablet 6   No current facility-administered medications for this visit.    PAST MEDICAL HISTORY: Past Medical History  Diagnosis Date  . Right supracondylar humerus  fracture 10/28/2014  . Hypertension   . Eye problem     Right eye bleed  . Diplopia     PAST SURGICAL HISTORY: Past Surgical History  Procedure Laterality Date  . Shoulder surgery Right     Rotator cuff  . Orif humerus fracture Right 10/28/2014    Procedure: RIGHT OPEN  REDUCTION INTERNAL FIXATION (ORIF) DISTAL HUMERUS; OSTEOTOMY ULNA;  Surgeon: Marchia Bond, MD;  Location: [place];  Service: Orthopedics;  Laterality: Right;  . Ulnar nerve transposition Right 10/28/2014    Procedure: TRANSPOSITION NEUROPLASTY ULNAR NERVE ELBOW ;  Surgeon: Marchia Bond, MD;  Location: [place];  Service: Orthopedics;  Laterality: Right;    FAMILY HISTORY: Family History  Problem Relation Age of Onset  . Diabetes Mother   . Diabetes Father   . Hypertension Father   . Glaucoma Father     SOCIAL HISTORY:  Social History   Social History  . Marital Status: Married    Spouse Name: N/A  . Number of Children: 2  . Years of Education: Bachelors   Occupational History  . Retired   . Drives for [employer]    Social History Main Topics  . Smoking status: Never Smoker   . Smokeless tobacco: Never Used  . Alcohol Use: No  . Drug Use: No  . Sexual Activity: Not on file   Other Topics Concern  . Not on file   Social History Narrative   Lives at home with his wife.   Right-handed.   2 cups caffeine per day.     PHYSICAL EXAM   Filed Vitals:   09/12/15 0748  BP: 143/81  Pulse: 65  Height: 5' 6.75" (1.695 m)  Weight: 172 lb 12 oz (78.359 kg)    Not recorded      Body mass index is 27.27 kg/(m^2).  PHYSICAL EXAMNIATION:  Gen: NAD, conversant, well nourised, obese, well groomed                     Cardiovascular: Regular rate rhythm, no peripheral edema, warm, nontender. Eyes: Conjunctivae clear without exudates or hemorrhage Neck: Supple, no carotid bruise. Pulmonary: Clear to auscultation bilaterally   NEUROLOGICAL EXAM:  MENTAL STATUS: Speech:    Speech is normal; fluent and spontaneous with normal comprehension.  Cognition:     Orientation to time, place and person     Normal recent and remote memory     Normal Attention span and concentration     Normal Language, naming,  repeating,spontaneous speech     Fund of knowledge   CRANIAL NERVES: CN II: Visual fields are full to confrontation. Pupil were small 16m, reactive to light, I was not able to visualize fundus.   CN III, IV, VI: cover and uncover testing showed right exo/inferopia, left exo/hyperopia, mild eye closure weakness. CN V: Facial sensation is intact to pinprick in all 3 divisions bilaterally. Corneal responses are intact.  CN VII: Face is symmetric with normal eye closure and smile. CN VIII: Hearing is normal to rubbing fingers CN IX, X: Palate elevates symmetrically. Phonation is normal. CN XI: Head turning and shoulder shrug are intact CN XII: Tongue is midline with normal movements and no atrophy.  MOTOR: Normal tone, bulk and strength  REFLEXES: Reflexes are 2+ and symmetric at the biceps, triceps, knees, and ankles. Plantar responses are flexor.  SENSORY: Intact to light touch, pinprick, positional sensation and vibratory sensation are intact in fingers and toes.  COORDINATION: Rapid alternating movements and fine finger movements are intact. There is no dysmetria on finger-to-nose and heel-knee-shin.    GAIT/STANCE: Posture is normal. Gait is steady with normal steps, base, arm swing, and turning. Heel and toe walking are normal. Tandem gait is normal.  Romberg is absent.   DIAGNOSTIC DATA (LABS, IMAGING, TESTING) - I reviewed patient records, labs, notes, testing and imaging myself where available.   ASSESSMENT AND PLAN  Dustin Le is a 75 y.o. 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  Marcial Pacas, M.D. Ph.D.  [facility] [address] [place], [place] [address] Ph: [phone] Fax: [phone]  CC: Lona Kettle, MD

## 2015-09-12 NOTE — Telephone Encounter (Signed)
Lisa[facility] called with a question regarding cyanocobalamin (,VITAMIN B-12,) 1000 MCG/ML injection. Please call

## 2015-09-12 NOTE — Telephone Encounter (Signed)
[place] rx has been clarified that the injections are once monthly.

## 2015-09-17 LAB — ACETYLCHOLINE RECEPTOR AB, ALL
Acetylchol Block Ab: 13 % (ref 0–25)
Acetylcholine Modulat Ab: <12 % (ref 0–20)

## 2015-09-17 LAB — SEDIMENTATION RATE: Sed Rate: 2 mm/hr (ref 0–30)

## 2015-09-17 LAB — C-REACTIVE PROTEIN: CRP: 0.7 mg/L (ref 0.0–4.9)

## 2015-09-17 LAB — FOLATE: FOLATE: 11.3 ng/mL (ref >3.0)

## 2015-09-17 LAB — THYROID PANEL WITH TSH
Free Thyroxine Index: 1.9 (ref 1.2–4.9)
T3 UPTAKE RATIO: 29 % (ref 24–39)
T4 TOTAL: 6.6 ug/dL (ref 4.5–12.0)
TSH: 2.55 u[IU]/mL (ref 0.450–4.500)

## 2015-09-17 LAB — RPR: RPR Ser Ql: NONREACTIVE

## 2015-09-17 LAB — ANA W/REFLEX IF POSITIVE: Anti Nuclear Antibody(ANA): NEGATIVE

## 2015-09-17 LAB — VITAMIN B12: Vitamin B-12: 172 pg/mL — ABNORMAL LOW (ref 211–946)

## 2015-09-17 LAB — CK: Total CK: 60 U/L (ref 24–204)

## 2016-01-11 ENCOUNTER — Ambulatory Visit (INDEPENDENT_AMBULATORY_CARE_PROVIDER_SITE_OTHER): Payer: Medicare Other | Admitting: Neurology

## 2016-01-11 ENCOUNTER — Encounter: Payer: Self-pay | Admitting: Neurology

## 2016-01-11 VITALS — BP 161/83 | HR 74 | Ht 66.75 in | Wt 169.5 lb

## 2016-01-11 DIAGNOSIS — H4912 Fourth [trochlear] nerve palsy, left eye: Secondary | ICD-10-CM

## 2016-01-11 DIAGNOSIS — E538 Deficiency of other specified B group vitamins: Secondary | ICD-10-CM | POA: Diagnosis not present

## 2016-01-11 NOTE — Progress Notes (Signed)
Chief Complaint  Patient presents with  . Diplopia    He is here with wife, Dustin Le.  Says his diplopia has resolved.  He is still taking aspirin 24m daily.  .Marland KitchenB12 Deficiency    His wife is giving him B12 injections monthly.      PATIENT: Dustin Le: [id number] Chief Complaint  Patient presents with  . Diplopia    He is here with wife, Dustin Le  Says his diplopia has resolved.  He is still taking aspirin 885mdaily.  . Marland Kitchen12 Deficiency    His wife is giving him B12 injections monthly.     HISTORICAL  Dustin DUFFEYs a [age]ears old right handed male, accompanied by his wife, seen in refer by his primary care physician Dr. AlMelinda Crutchor evaluation of diplopia on July 3rd 2017.  I reviewed and summarized the referring note, he had one episode right vetreous bleeding in 2014, [facility], most recent lab in June 2017, showed normal CBC,Hg 16.8, normal CMP, creat 0.78, ESR was one, LDL was mildly elevated 107, cholesterol 171.  On June 6th 2017, while working on his home project,he noticed depth perception difficulty, later he noticed constant double vision, worst when looking to right, mostly vertical double vision,  better when looking to the left, began to appear as gaze close to midline.  His double vision seems to be least during early morning.  He denies ptosis, bulbar or limb muscle weakness. He denies dizziness.  He was seen by opthamologist Dr. 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  Otherwise unremarkable for age MRI appearance of the brain and orbits. No cavernous sinus or skullbase abnormality.  UPDATE July 18th 2017: He was evaluated by ophthalmologist Dr. 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 PaPosey Prontogain, he continue complains of double vision when looking to the right side, on today's red less testing showed improvement, only has vertical double vision looking to the right inferior visual field.  We have personally reviewed MRA of the brain, mild intracranial atherosclerotic disease, especially on the left PCA  We have reviewed laboratory evaluation, low vitamin B12 172, elevated homocystine level, normal folic acid, normal TSH, negative ANA, ESR, C-reactive protein, acetylcholine receptor antibodies   he has tried to M[place] without significant improvement, does complains of increased mucous secretion.  UPDATE Nov 16th 2017:  His double vision has disappeared, after vitamin B12 supplement, he seems to be more energetic, he is getting Vit B12 by his wife.  REVIEW OF SYSTEMS: Full 14 system review of systems performed and notable only for as above  ALLERGIES: No Known Allergies  HOME MEDICATIONS: Current Outpatient Prescriptions  Medication Sig Dispense Refill  . aspirin 81 MG tablet Take 81 mg by mouth daily.    . cyanocobalamin (,VITAMIN B-12,) 1000 MCG/ML  injection Inject 1 mL (1,000 mcg total) into the muscle once. 1 mL 11   No current facility-administered medications for this visit.     PAST MEDICAL HISTORY: Past Medical History:  Diagnosis Date  . Diplopia   . Eye problem    Right eye bleed  . Hypertension   . Right supracondylar humerus fracture 10/28/2014    PAST SURGICAL HISTORY: Past  Surgical History:  Procedure Laterality Date  . ORIF HUMERUS FRACTURE Right 10/28/2014   Procedure: RIGHT OPEN REDUCTION INTERNAL FIXATION (ORIF) DISTAL HUMERUS; OSTEOTOMY ULNA;  Surgeon: Marchia Bond, MD;  Location: [facility];  Service: Orthopedics;  Laterality: Right;  . SHOULDER SURGERY Right    Rotator cuff  . ULNAR NERVE TRANSPOSITION Right 10/28/2014   Procedure: TRANSPOSITION NEUROPLASTY ULNAR NERVE ELBOW ;  Surgeon: Marchia Bond, MD;  Location: [facility];  Service: Orthopedics;  Laterality: Right;    FAMILY HISTORY: Family History  Problem Relation Age of Onset  . Diabetes Mother   . Diabetes Father   . Hypertension Father   . Glaucoma Father     SOCIAL HISTORY:  Social History   Social History  . Marital status: Married    Spouse name: N/A  . Number of children: 2  . Years of education: Bachelors   Occupational History  . Retired   . Drives for [employer]    Social History Main Topics  . Smoking status: Never Smoker  . Smokeless tobacco: Never Used  . Alcohol use No  . Drug use: No  . Sexual activity: Not on file   Other Topics Concern  . Not on file   Social History Narrative   Lives at home with his wife.   Right-handed.   2 cups caffeine per day.     PHYSICAL EXAM   Vitals:   01/11/16 0726  BP: (!) 161/83  Pulse: 74  Weight: 169 lb 8 oz (76.9 kg)  Height: 5' 6.75" (1.695 m)    Not recorded      Body mass index is 26.75 kg/m.  PHYSICAL EXAMNIATION:  Gen: NAD, conversant, well nourised, obese, well groomed                     Cardiovascular: Regular rate rhythm, no peripheral edema, warm, nontender. Eyes: Conjunctivae clear without exudates or hemorrhage Neck: Supple, no carotid bruise. Pulmonary: Clear to auscultation bilaterally   NEUROLOGICAL EXAM:  MENTAL STATUS: Speech:    Speech is normal; fluent and spontaneous with normal comprehension.  Cognition:     Orientation to time,  place and person     Normal recent and remote memory     Normal Attention span and concentration     Normal Language, naming, repeating,spontaneous speech     Fund of knowledge   CRANIAL NERVES: CN II: Visual fields are full to confrontation. Pupil were small 22m, reactive to light, I was not able to visualize fundus.   CN III, IV, VI: cover and uncover testing showed right exo/inferopia, left exo/hyperopia, mild eye closure weakness. CN V: Facial sensation is intact to pinprick in all 3 divisions bilaterally. Corneal responses are intact.  CN VII: Face is symmetric with normal eye closure and smile. CN VIII: Hearing is normal to rubbing fingers CN IX, X: Palate elevates symmetrically. Phonation is normal. CN XI: Head turning and shoulder shrug are intact CN XII: Tongue is midline with normal movements and no atrophy.  MOTOR: Normal tone, bulk and strength  REFLEXES: Reflexes are 2+ and symmetric at the biceps, triceps, knees, and ankles. Plantar responses are flexor.  SENSORY: Intact to light touch, pinprick, positional sensation and vibratory sensation are intact in fingers and toes.  COORDINATION: Rapid alternating movements and fine finger movements are intact. There is no dysmetria on finger-to-nose and heel-knee-shin.    GAIT/STANCE: Posture is normal. Gait is steady with normal steps, base, arm swing, and turning. Heel and toe walking are normal. Tandem gait is normal.  Romberg is absent.   DIAGNOSTIC DATA (LABS, IMAGING, TESTING) - I reviewed patient records, labs, notes, testing and imaging myself where available.   ASSESSMENT AND PLAN  RAHSHAWN REMO is a 75 y.o. 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  Recheck level, may change to po Vit B12 supplement.  Marcial Pacas, M.D. Ph.D.  [facility] [address] [place], [place] [address] Ph: [phone] Fax: [phone]  CC: Lona Kettle, MD

## 2016-01-15 LAB — VITAMIN B12: VITAMIN B 12: 304 pg/mL (ref 211–946)

## 2016-01-15 LAB — METHYLMALONIC ACID, SERUM: Methylmalonic Acid: 211 nmol/L (ref 0–378)

## 2016-01-15 LAB — HOMOCYSTEINE: HOMOCYSTEINE: 9.7 umol/L (ref 0.0–15.0)

## 2016-01-22 ENCOUNTER — Encounter: Payer: Self-pay | Admitting: *Deleted

## 2016-12-20 IMAGING — MR MR HEAD WO/W CM
10 of 17 series · 26 of 48 positions shown · IV contrast (multihance)
Comparison: None.

CLINICAL DATA: 74-year-old male with vertical diplopia for 2 weeks.
Right 6th and left 4th cranial neuropathies. Initial encounter.

EXAM:
MRI HEAD AND ORBITS WITHOUT AND WITH CONTRAST
TECHNIQUE: Multiplanar, multiecho pulse sequences of the brain and surrounding
structures were obtained without and with intravenous contrast.
Multiplanar, multiecho pulse sequences of the orbits and surrounding
structures were obtained including fat saturation techniques, before
and after intravenous contrast administration.
CONTRAST:  15mL MULTIHANCE GADOBENATE DIMEGLUMINE 529 MG/ML IV SOLN

[Series 5: T1 · sagittal · 5.0mm · 0.45mm/px · 1 of 19 slices shown (1 of 2)]
[im 1/19]
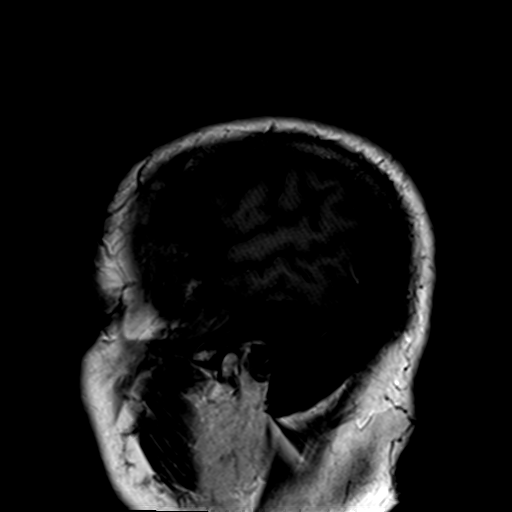

[Series 6: DWI · axial · 3.0mm · 0.94mm/px · z∈[-11,+136]mm · 8 of 100 slices shown]
[im 1/100]
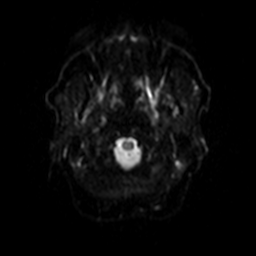
[im 15/100]
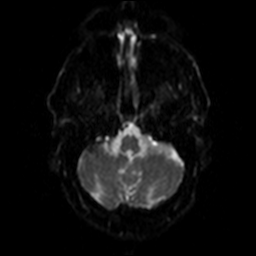
[im 29/100]
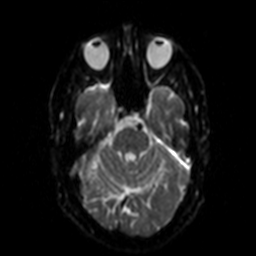
[im 43/100]
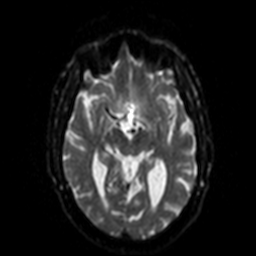
[im 57/100]
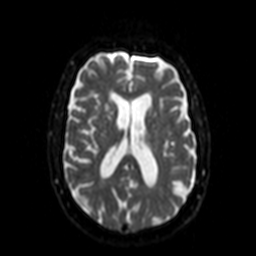
[im 71/100]
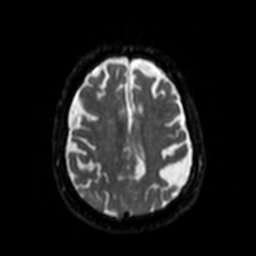
[im 85/100]
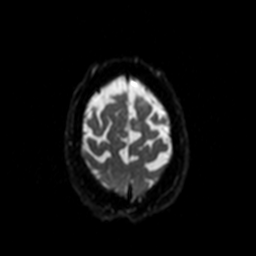
[im 100/100]
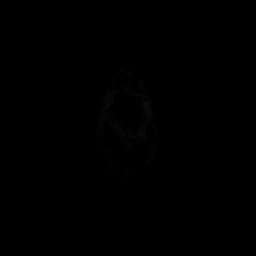

[Series 8: T2 · axial · 5.0mm · 0.51mm/px · z∈[-10,+145]mm · 2 of 24 slices shown (1 of 2)]
[im 1/24]
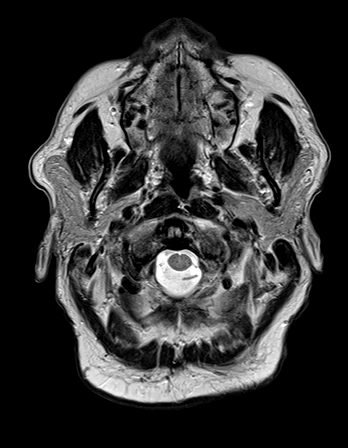
[im 24/24]
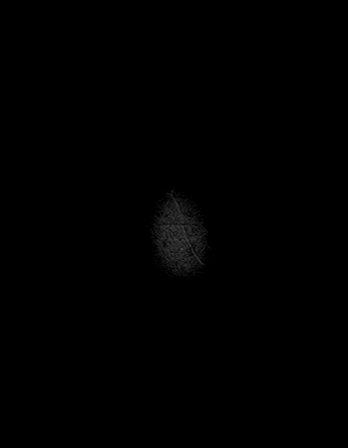

[Series 10: FLAIR · axial · 5.0mm · 0.45mm/px · z∈[-10,+146]mm · 2 of 24 slices shown (1 of 2)]
[im 1/24]
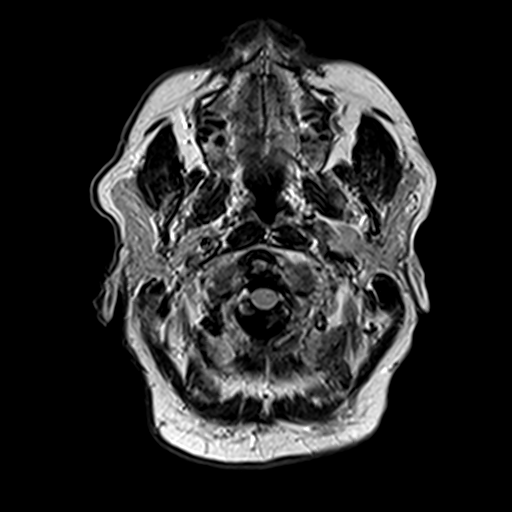
[im 24/24]
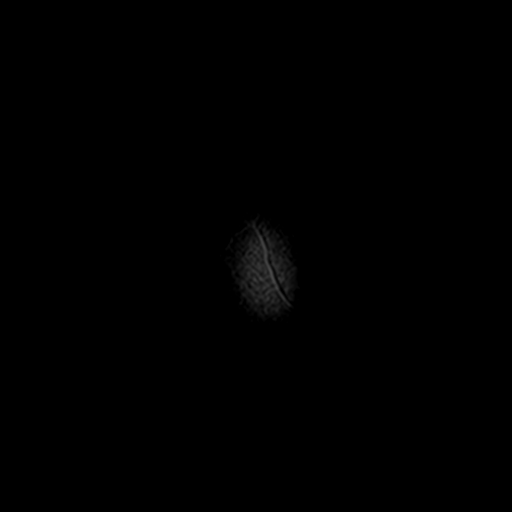

[Series 12: T2 · coronal · 5.0mm · 0.45mm/px · 3 of 32 slices shown (2 of 2)]
[im 1/32]
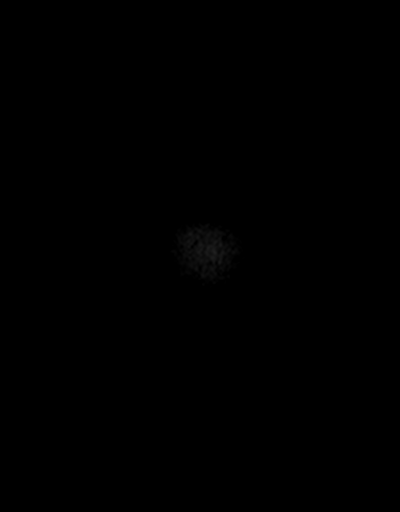
[im 16/32]
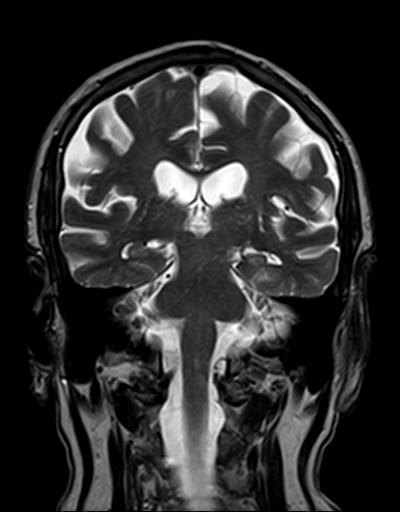
[im 32/32]
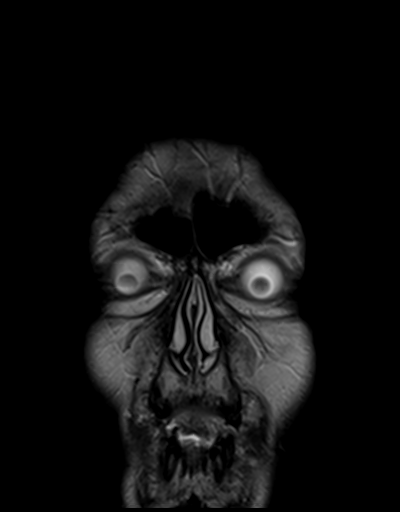

[Series 13: FLAIR · sagittal · 5.0mm · 0.45mm/px · 2 of 19 slices shown (2 of 2)]
[im 1/19]
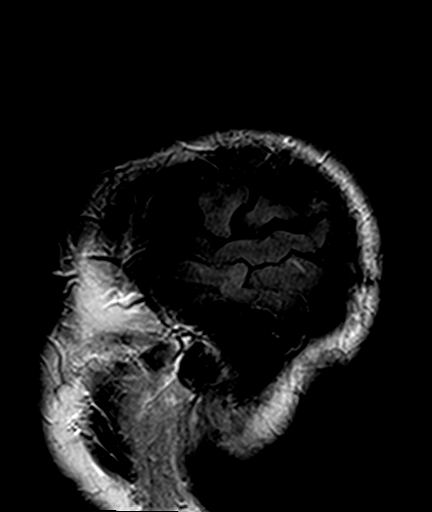
[im 19/19]
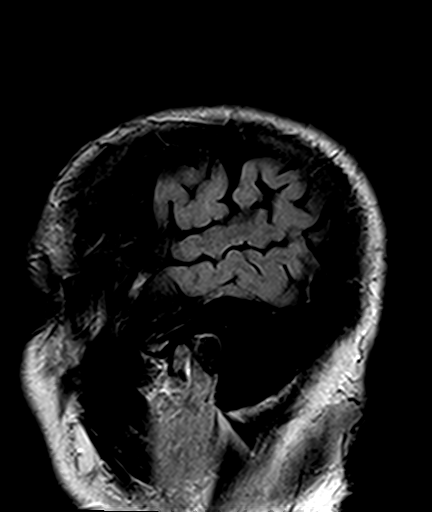

[Series 14: T1 · coronal · 3.0mm · 0.35mm/px · 2 of 24 slices shown (2 of 2)]
[im 1/24]
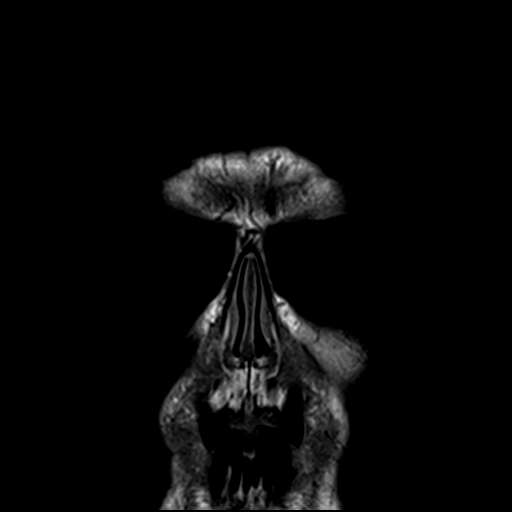
[im 24/24]
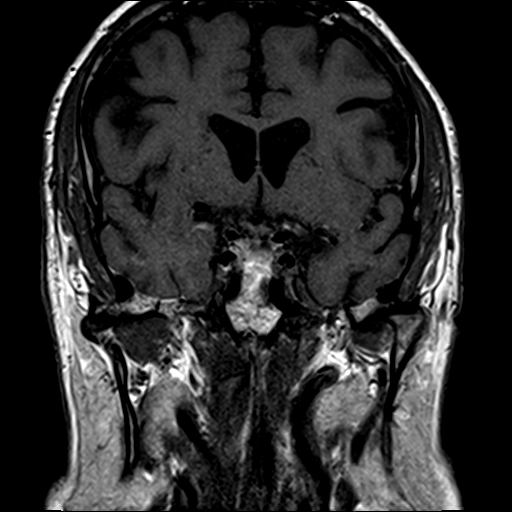

[Series 15: T2 fat-sat · coronal · 3.0mm · 0.35mm/px · 2 of 24 slices shown (1 of 2)]
[im 1/24]
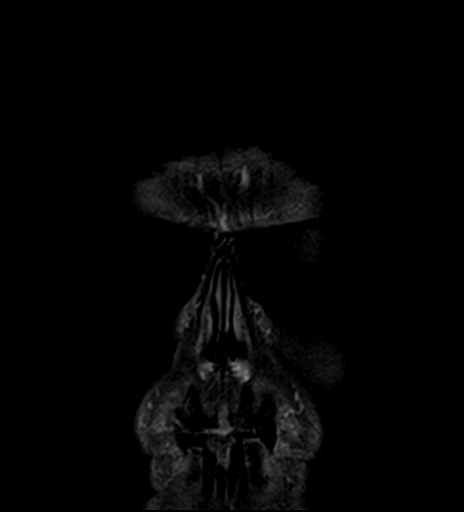
[im 24/24]
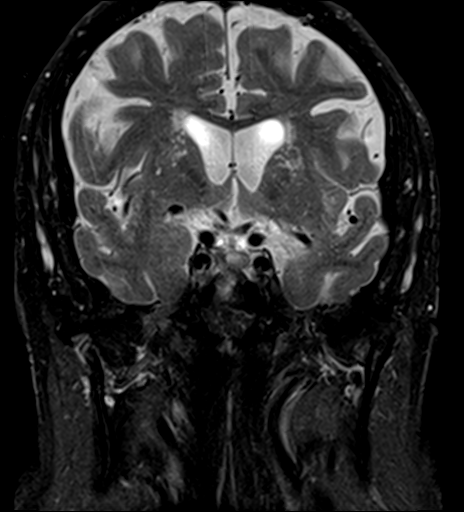

[Series 17: T2 fat-sat · axial · 3.0mm · 0.35mm/px · 1 of 18 slices shown (2 of 2)]
[im 1/18]
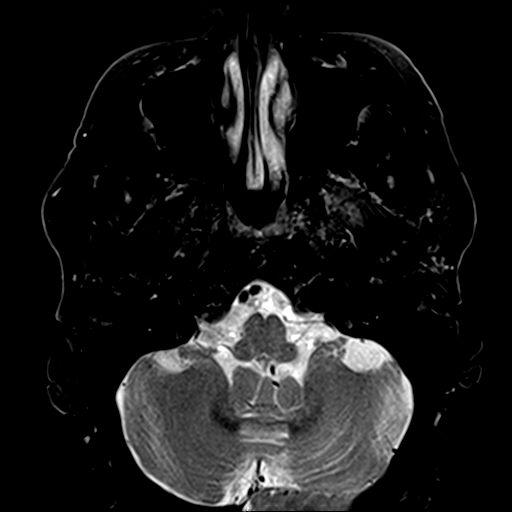

[Series 19: T1 post-contrast · coronal · 5.0mm · 0.45mm/px · 3 of 32 slices shown]
[im 1/32]
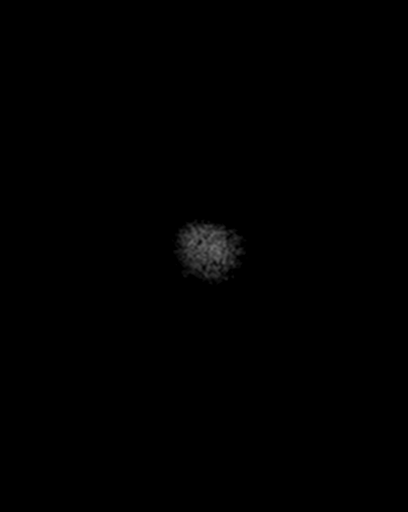
[im 16/32]
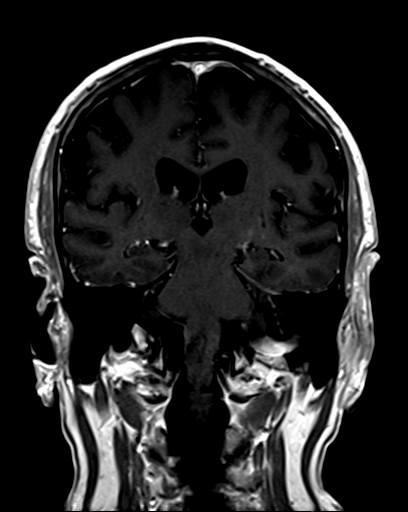
[im 32/32]
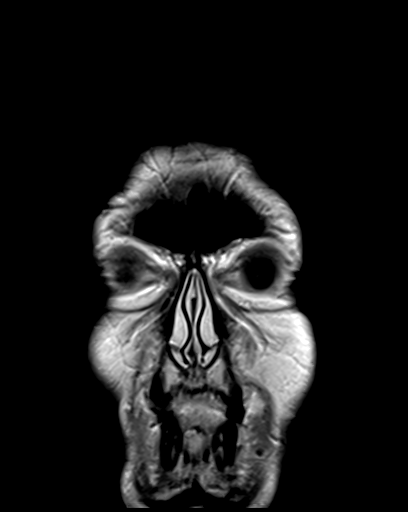

[26 of 48 positions shown; findings below may reference images not displayed]

FINDINGS: MRI HEAD FINDINGS

No restricted diffusion to suggest acute infarction. No midline
shift, mass effect, evidence of mass lesion, ventriculomegaly,
extra-axial collection or acute intracranial hemorrhage.
Cervicomedullary junction and pituitary are within normal limits.
Negative visualized cervical spine. Major intracranial vascular flow
voids are preserved with generalized intracranial artery
dolichoectasia.

There are increased perivascular spaces in the deep gray matter
nuclei. Overall gray and white matter signal is normal for age. No
abnormal enhancement identified. No dural thickening. No abnormal
signal in the midbrain or brainstem. No cortical encephalomalacia or
chronic cerebral blood products are identified.

Normal bone marrow signal. Paranasal sinuses and mastoids are clear.
Visible internal auditory structures appear normal. Negative scalp
soft tissues.

MRI ORBITS FINDINGS

Generalized dolichoectasia of the intracranial arteries is re-
demonstrated, including tortuosity at the basilar tip which does
indent the undersurface of the third ventricle as seen on series 13,
image 10. Mildly tortuous left posterior communicating artery.

The cavernous sinus is normal. The dorsal clivus appears normal. No
suprasellar mass or mass effect. The optic chiasm and bilateral
optic nerves appear normal. No intraorbital mass or inflammation. No
abnormal intraorbital enhancement. Globes are intact. Lacrimal
glands appear normal. No inflammatory changes identified.
IMPRESSION: 1. Generalized intracranial artery dolichoectasia, such as from
chronic hypertension, including mass effect on the base of the brain
at the floor of the third ventricle from basilar artery tortuosity.
No discrete aneurysm is evident, although intracranial MRA (without
contrast) or CTA (with contrast) would be necessary to exclude
aneurysm.
2. Otherwise unremarkable for age MRI appearance of the brain and
orbits. No cavernous sinus or skullbase abnormality.

## 2020-09-21 DIAGNOSIS — Z23 Encounter for immunization: Secondary | ICD-10-CM | POA: Diagnosis not present

## 2020-12-04 DIAGNOSIS — H26493 Other secondary cataract, bilateral: Secondary | ICD-10-CM | POA: Diagnosis not present

## 2020-12-04 DIAGNOSIS — H43813 Vitreous degeneration, bilateral: Secondary | ICD-10-CM | POA: Diagnosis not present

## 2020-12-04 DIAGNOSIS — Z961 Presence of intraocular lens: Secondary | ICD-10-CM | POA: Diagnosis not present

## 2020-12-04 DIAGNOSIS — H35373 Puckering of macula, bilateral: Secondary | ICD-10-CM | POA: Diagnosis not present

## 2021-09-11 DIAGNOSIS — R29818 Other symptoms and signs involving the nervous system: Secondary | ICD-10-CM | POA: Diagnosis not present

## 2021-09-11 DIAGNOSIS — E538 Deficiency of other specified B group vitamins: Secondary | ICD-10-CM | POA: Diagnosis not present

## 2021-09-11 DIAGNOSIS — G319 Degenerative disease of nervous system, unspecified: Secondary | ICD-10-CM | POA: Diagnosis not present

## 2021-09-11 DIAGNOSIS — R4781 Slurred speech: Secondary | ICD-10-CM | POA: Diagnosis not present

## 2021-10-23 DIAGNOSIS — R1310 Dysphagia, unspecified: Secondary | ICD-10-CM | POA: Diagnosis not present

## 2021-10-24 DIAGNOSIS — R1310 Dysphagia, unspecified: Secondary | ICD-10-CM | POA: Diagnosis not present

## 2021-10-24 DIAGNOSIS — R4781 Slurred speech: Secondary | ICD-10-CM | POA: Diagnosis not present

## 2021-11-09 DIAGNOSIS — R413 Other amnesia: Secondary | ICD-10-CM | POA: Diagnosis not present

## 2021-11-10 DIAGNOSIS — R413 Other amnesia: Secondary | ICD-10-CM | POA: Diagnosis not present

## 2021-11-10 DIAGNOSIS — R9082 White matter disease, unspecified: Secondary | ICD-10-CM | POA: Diagnosis not present

## 2022-01-09 DIAGNOSIS — H26492 Other secondary cataract, left eye: Secondary | ICD-10-CM | POA: Diagnosis not present

## 2022-01-09 DIAGNOSIS — Z961 Presence of intraocular lens: Secondary | ICD-10-CM | POA: Diagnosis not present

## 2022-01-09 DIAGNOSIS — H43813 Vitreous degeneration, bilateral: Secondary | ICD-10-CM | POA: Diagnosis not present

## 2022-01-09 DIAGNOSIS — H35373 Puckering of macula, bilateral: Secondary | ICD-10-CM | POA: Diagnosis not present

## 2022-03-12 DIAGNOSIS — R1030 Lower abdominal pain, unspecified: Secondary | ICD-10-CM | POA: Diagnosis not present

## 2022-03-12 DIAGNOSIS — R339 Retention of urine, unspecified: Secondary | ICD-10-CM | POA: Diagnosis not present

## 2022-03-12 DIAGNOSIS — R39198 Other difficulties with micturition: Secondary | ICD-10-CM | POA: Diagnosis not present

## 2022-03-26 DIAGNOSIS — R339 Retention of urine, unspecified: Secondary | ICD-10-CM | POA: Diagnosis not present

## 2022-03-31 DIAGNOSIS — N390 Urinary tract infection, site not specified: Secondary | ICD-10-CM | POA: Diagnosis not present

## 2022-03-31 DIAGNOSIS — R1030 Lower abdominal pain, unspecified: Secondary | ICD-10-CM | POA: Diagnosis not present

## 2022-03-31 DIAGNOSIS — R339 Retention of urine, unspecified: Secondary | ICD-10-CM | POA: Diagnosis not present

## 2022-04-23 DIAGNOSIS — R339 Retention of urine, unspecified: Secondary | ICD-10-CM | POA: Diagnosis not present

## 2022-05-10 DIAGNOSIS — N390 Urinary tract infection, site not specified: Secondary | ICD-10-CM | POA: Diagnosis not present

## 2022-05-10 DIAGNOSIS — R339 Retention of urine, unspecified: Secondary | ICD-10-CM | POA: Diagnosis not present

## 2022-05-10 DIAGNOSIS — G1220 Motor neuron disease, unspecified: Secondary | ICD-10-CM | POA: Diagnosis not present

## 2022-05-10 DIAGNOSIS — T83592A Infection and inflammatory reaction due to indwelling ureteral stent, initial encounter: Secondary | ICD-10-CM | POA: Diagnosis not present

## 2022-05-16 DIAGNOSIS — R339 Retention of urine, unspecified: Secondary | ICD-10-CM | POA: Diagnosis not present

## 2022-05-17 DIAGNOSIS — R339 Retention of urine, unspecified: Secondary | ICD-10-CM | POA: Diagnosis not present

## 2022-05-20 DIAGNOSIS — M5031 Other cervical disc degeneration,  high cervical region: Secondary | ICD-10-CM | POA: Diagnosis not present

## 2022-05-20 DIAGNOSIS — M5134 Other intervertebral disc degeneration, thoracic region: Secondary | ICD-10-CM | POA: Diagnosis not present

## 2022-05-20 DIAGNOSIS — M47816 Spondylosis without myelopathy or radiculopathy, lumbar region: Secondary | ICD-10-CM | POA: Diagnosis not present

## 2022-05-20 DIAGNOSIS — M5137 Other intervertebral disc degeneration, lumbosacral region: Secondary | ICD-10-CM | POA: Diagnosis not present

## 2022-05-20 DIAGNOSIS — M50321 Other cervical disc degeneration at C4-C5 level: Secondary | ICD-10-CM | POA: Diagnosis not present

## 2022-05-20 DIAGNOSIS — M50322 Other cervical disc degeneration at C5-C6 level: Secondary | ICD-10-CM | POA: Diagnosis not present

## 2022-05-20 DIAGNOSIS — G1220 Motor neuron disease, unspecified: Secondary | ICD-10-CM | POA: Diagnosis not present

## 2022-05-21 DIAGNOSIS — R836 Abnormal cytological findings in cerebrospinal fluid: Secondary | ICD-10-CM | POA: Diagnosis not present

## 2022-05-21 DIAGNOSIS — G1220 Motor neuron disease, unspecified: Secondary | ICD-10-CM | POA: Diagnosis not present

## 2022-05-21 DIAGNOSIS — G1221 Amyotrophic lateral sclerosis: Secondary | ICD-10-CM | POA: Diagnosis not present

## 2022-05-23 DIAGNOSIS — G1220 Motor neuron disease, unspecified: Secondary | ICD-10-CM | POA: Diagnosis not present

## 2022-05-28 DIAGNOSIS — G1221 Amyotrophic lateral sclerosis: Secondary | ICD-10-CM | POA: Diagnosis not present

## 2022-06-18 DIAGNOSIS — R339 Retention of urine, unspecified: Secondary | ICD-10-CM | POA: Diagnosis not present

## 2022-06-18 DIAGNOSIS — R3914 Feeling of incomplete bladder emptying: Secondary | ICD-10-CM | POA: Diagnosis not present

## 2022-06-20 DIAGNOSIS — G1221 Amyotrophic lateral sclerosis: Secondary | ICD-10-CM | POA: Diagnosis not present

## 2022-07-03 DIAGNOSIS — R1310 Dysphagia, unspecified: Secondary | ICD-10-CM | POA: Diagnosis not present

## 2022-07-04 DIAGNOSIS — M6281 Muscle weakness (generalized): Secondary | ICD-10-CM | POA: Diagnosis not present

## 2022-07-04 DIAGNOSIS — G1221 Amyotrophic lateral sclerosis: Secondary | ICD-10-CM | POA: Diagnosis not present

## 2022-07-10 DIAGNOSIS — G1221 Amyotrophic lateral sclerosis: Secondary | ICD-10-CM | POA: Diagnosis not present

## 2022-07-10 DIAGNOSIS — Z7409 Other reduced mobility: Secondary | ICD-10-CM | POA: Diagnosis not present

## 2022-07-15 DIAGNOSIS — R339 Retention of urine, unspecified: Secondary | ICD-10-CM | POA: Diagnosis not present

## 2022-07-26 DIAGNOSIS — G1221 Amyotrophic lateral sclerosis: Secondary | ICD-10-CM | POA: Diagnosis not present

## 2022-08-09 DIAGNOSIS — G1221 Amyotrophic lateral sclerosis: Secondary | ICD-10-CM | POA: Diagnosis not present

## 2022-08-26 DEATH — deceased
